# Patient Record
Sex: Male | Born: 1985 | Race: Black or African American | Hispanic: No | Marital: Single | Smoking: Never smoker
Health system: Southern US, Community
[De-identification: ages and names within clinical notes are randomized; demographics above are authoritative.]

## PROBLEM LIST (undated history)

## (undated) DIAGNOSIS — L0501 Pilonidal cyst with abscess: Secondary | ICD-10-CM

---
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
## 2006-03-05 ENCOUNTER — Emergency Department: Payer: Self-pay | Admitting: Emergency Medicine

## 2006-10-30 ENCOUNTER — Emergency Department: Payer: Self-pay | Admitting: Emergency Medicine

## 2006-11-12 ENCOUNTER — Emergency Department: Payer: Self-pay | Admitting: Internal Medicine

## 2006-12-09 ENCOUNTER — Emergency Department: Payer: Self-pay | Admitting: General Practice

## 2009-05-30 ENCOUNTER — Emergency Department (HOSPITAL_COMMUNITY): Admission: EM | Admit: 2009-05-30 | Discharge: 2009-05-30 | Payer: Self-pay | Admitting: Emergency Medicine

## 2009-09-01 ENCOUNTER — Emergency Department (HOSPITAL_COMMUNITY): Admission: EM | Admit: 2009-09-01 | Discharge: 2009-09-01 | Payer: Self-pay | Admitting: Emergency Medicine

## 2011-03-07 ENCOUNTER — Inpatient Hospital Stay (INDEPENDENT_AMBULATORY_CARE_PROVIDER_SITE_OTHER)
Admission: RE | Admit: 2011-03-07 | Discharge: 2011-03-07 | Disposition: A | Discharge status: Home / Self Care | Payer: Self-pay | Source: Ambulatory Visit | Attending: Family Medicine | Admitting: Family Medicine

## 2011-03-07 DIAGNOSIS — T148XXA Other injury of unspecified body region, initial encounter: Secondary | ICD-10-CM

## 2012-09-24 ENCOUNTER — Other Ambulatory Visit: Payer: Self-pay | Admitting: Physical Medicine and Rehabilitation

## 2012-09-24 ENCOUNTER — Ambulatory Visit
Admission: RE | Admit: 2012-09-24 | Discharge: 2012-09-24 | Disposition: A | Discharge status: Home / Self Care | Payer: No Typology Code available for payment source | Source: Ambulatory Visit | Attending: Physical Medicine and Rehabilitation | Admitting: Physical Medicine and Rehabilitation

## 2012-09-24 DIAGNOSIS — Z021 Encounter for pre-employment examination: Secondary | ICD-10-CM

## 2012-10-27 ENCOUNTER — Emergency Department (INDEPENDENT_AMBULATORY_CARE_PROVIDER_SITE_OTHER): Payer: Self-pay

## 2012-10-27 ENCOUNTER — Emergency Department (INDEPENDENT_AMBULATORY_CARE_PROVIDER_SITE_OTHER)
Admission: EM | Admit: 2012-10-27 | Discharge: 2012-10-27 | Disposition: A | Discharge status: Home / Self Care | Payer: Self-pay | Source: Home / Self Care

## 2012-10-27 ENCOUNTER — Encounter (HOSPITAL_COMMUNITY): Payer: Self-pay | Admitting: Emergency Medicine

## 2012-10-27 DIAGNOSIS — L0390 Cellulitis, unspecified: Secondary | ICD-10-CM

## 2012-10-27 DIAGNOSIS — L0291 Cutaneous abscess, unspecified: Secondary | ICD-10-CM

## 2012-10-27 DIAGNOSIS — J069 Acute upper respiratory infection, unspecified: Secondary | ICD-10-CM

## 2012-10-27 HISTORY — DX: Pilonidal cyst with abscess: L05.01

## 2012-10-27 MED ORDER — HYDROCODONE-ACETAMINOPHEN 5-325 MG PO TABS: 1.0000 | ORAL_TABLET | ORAL | Status: DC | PRN

## 2012-10-27 MED ORDER — HYDROCODONE-ACETAMINOPHEN 5-325 MG PO TABS
ORAL_TABLET | ORAL | Status: AC
  Filled 2012-10-27: qty 2

## 2012-10-27 MED ORDER — HYDROCODONE-ACETAMINOPHEN 5-325 MG PO TABS
2.0000 | ORAL_TABLET | Freq: Once | ORAL | Status: AC
  Administered 2012-10-27: 2 via ORAL

## 2012-10-27 MED ORDER — IBUPROFEN 800 MG PO TABS
800.0000 mg | ORAL_TABLET | Freq: Once | ORAL | Status: AC
  Administered 2012-10-27: 800 mg via ORAL

## 2012-10-27 MED ORDER — DOXYCYCLINE HYCLATE 100 MG PO CAPS: 100.0000 mg | ORAL_CAPSULE | Freq: Two times a day (BID) | ORAL | Status: DC

## 2012-10-27 MED ORDER — IBUPROFEN 800 MG PO TABS
ORAL_TABLET | ORAL | Status: AC
  Filled 2012-10-27: qty 1

## 2012-10-27 NOTE — ED Notes (Signed)
Pt c/o cold sx x3.5 weeks.  Sx include: dizziness, fatigue, fevers, dry cough Denies: vomiting, nauseas, diarrhea  Also c/o abscess near coccyx x2 weeks.  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

## 2012-10-27 NOTE — ED Provider Notes (Signed)
History     CSN: [id number]  Arrival date & time 10/27/12  1611   None     Chief Complaint  Patient presents with  . URI  . Abscess     HPI Pt c/o cold sx x 3.5 weeks. Sx's have included dizziness, fatigue, intermittent fevers and a dry cough that just started today. Denies abd pain,vomiting, nauseas or diarrhea.  Also c/o abscess near coccyx x 2 weeks. States mild TTP started approx 2 weeks ago and has now progressed in size w/ pain that now  radiates towards both legs and back. Admits to hx of these (Pilonidal) cyst before that have often required lancing.     Past Medical History  Diagnosis Date  . Pilonidal cyst with abscess     History reviewed. No pertinent past surgical history.  No family history on file.  History  Substance Use Topics  . Smoking status: Never Smoker   . Smokeless tobacco: Not on file  . Alcohol Use: No      Review of Systems  Constitutional: Positive for fever, chills and fatigue.  HENT: Negative for ear pain, congestion, rhinorrhea and neck pain.   Eyes: Negative for pain.  Respiratory: Positive for cough. Negative for chest tightness, shortness of breath, wheezing and stridor.   Cardiovascular: Negative for leg swelling.  Genitourinary: Negative.   Musculoskeletal: Positive for back pain.  Neurological: Positive for dizziness.  Hematological: Negative.   Psychiatric/Behavioral: Negative.     Allergies  Review of patient's allergies indicates no known allergies.  Home Medications   Current Outpatient Rx  Name  Route  Sig  Dispense  Refill  . ASPIRIN PO   Oral   Take by mouth.         . IBUPROFEN 600 MG PO TABS   Oral   Take 600 mg by mouth every 6 (six) hours as needed.           BP 109/54  Pulse 149  Temp 101.8 F (38.8 C) (Oral)  Resp 22  SpO2 98%  Physical Exam  Constitutional: He is oriented to person, place, and time. He appears well-developed and well-nourished. He appears ill.  HENT:  Head:  Normocephalic and atraumatic.  Right Ear: Tympanic membrane and external ear normal.  Left Ear: Tympanic membrane and external ear normal.  Nose: Nose normal.  Mouth/Throat: Uvula is midline, oropharynx is clear and moist and mucous membranes are normal.  Eyes: Conjunctivae normal are normal.  Neck: Normal range of motion. Neck supple.  Cardiovascular: Normal rate and regular rhythm.   Pulmonary/Chest: Effort normal.  Neurological: He is alert and oriented to person, place, and time.  Skin: Skin is warm and dry.          Approx 3 cm raised erythematous nodule just above the gluteal fold cleft at the coccyx area. No active drainage. Small  amount of erythema and induration at periphery of the wound.  Warm to touch. Very TTP. Small area of fluctuance at the inferior aspect of the abscess.    Psychiatric: He has a normal mood and affect.    ED Course  INCISION AND DRAINAGE Date/Time: 10/27/2012 7:30 PM Performed by: Leanne Chang Authorized by: Leanne Chang Consent: Verbal consent obtained. Written consent not obtained. Risks and benefits: risks, benefits and alternatives were discussed Consent given by: patient Patient understanding: patient states understanding of the procedure being performed Required items: required blood products, implants, devices, and special equipment available Patient identity confirmed: verbally with  patient and arm band Type: abscess Anesthesia: local infiltration Local anesthetic: lidocaine 1% without epinephrine Anesthetic total: 2.5 ml Patient sedated: no Scalpel size: 11 Incision type: single straight Complexity: simple Drainage: bloody Drainage amount: moderate Wound treatment: drain placed Packing material: 1/2 in iodoform gauze Patient tolerance: Patient tolerated the procedure well with no immediate complications. Comments: Minimal purulent drainage    (including critical care time)   Labs Reviewed  INFLUENZA PANEL BY PCR    Dg Chest 2 View  10/27/2012  *RADIOLOGY REPORT*  Clinical Data: Cough and fever  CHEST - 2 VIEW  Comparison: 09/24/2012  Findings: The heart size and vascularity are normal.  Lungs are clear without infiltrate or effusion.  No mass lesion and no change from the prior study.  IMPRESSION: No active cardiopulmonary disease.   Original Report Authenticated By: Janeece Riggers, M.D.     URI Pilonidal abscess    MDM  2 to 3 weeks of URI sx's w/ recent onset of fever and cough. CXR negative. BBS CTA. Explained importance of rest, meds for fever and hydration. Likely viral process. 2 weeks worsening pain and swelling of pilonidal cyst. H/o same. I&D performed. Packing placed. Pt ask to do warm soaks/baths/sitz as often as able and return w/ packing in 2 days for recheck as wound may need packing again. Pt agreeable.       Leanne Chang, NP 10/29/12 1229

## 2012-10-30 ENCOUNTER — Encounter (HOSPITAL_COMMUNITY): Payer: Self-pay | Admitting: *Deleted

## 2012-10-30 ENCOUNTER — Emergency Department (INDEPENDENT_AMBULATORY_CARE_PROVIDER_SITE_OTHER)
Admission: EM | Admit: 2012-10-30 | Discharge: 2012-10-30 | Disposition: A | Discharge status: Home / Self Care | Payer: Self-pay | Source: Home / Self Care | Attending: Family Medicine | Admitting: Family Medicine

## 2012-10-30 DIAGNOSIS — L0501 Pilonidal cyst with abscess: Secondary | ICD-10-CM

## 2012-10-30 NOTE — ED Notes (Signed)
Seen      sev  Days  Ago  ucc   Had  i  And  D       And  Packing  Pt  States  He  Had  Another  Boil  Come  Up  As  Well  He  reportts  He  Got  His  meds  Filled             He  Reports  He  Feels   Somewhat  Better

## 2012-10-30 NOTE — ED Provider Notes (Signed)
History     CSN: [id number]  Arrival date & time 10/30/12  1148   First MD Initiated Contact with Patient 10/30/12 1202      Chief Complaint  Patient presents with  . Follow-up    (Consider location/radiation/quality/duration/timing/severity/associated sxs/prior treatment) Patient is a 27 y.o. male presenting with abscess. The history is provided by the patient.  Abscess  This is a new problem. The current episode started less than one week ago (seen 1/4 for i+d of pilonidal abscess, here for recheck with 2nd site spont draining while waiting [facility] The onset was sudden. The problem has been gradually worsening. The abscess is present on the left buttock. The problem is moderate. The abscess is characterized by draining, swelling and painfulness.    Past Medical History  Diagnosis Date  . Pilonidal cyst with abscess     History reviewed. No pertinent past surgical history.  No family history on file.  History  Substance Use Topics  . Smoking status: Never Smoker   . Smokeless tobacco: Not on file  . Alcohol Use: No      Review of Systems  Constitutional: Negative.   Skin: Positive for wound.    Allergies  Review of patient's allergies indicates no known allergies.  Home Medications   Current Outpatient Rx  Name  Route  Sig  Dispense  Refill  . ASPIRIN PO   Oral   Take by mouth.         . DOXYCYCLINE HYCLATE 100 MG PO CAPS   Oral   Take 1 capsule (100 mg total) by mouth 2 (two) times daily.   20 capsule   0   . HYDROCODONE-ACETAMINOPHEN 5-325 MG PO TABS   Oral   Take 1 tablet by mouth every 4 (four) hours as needed for pain.   10 tablet   0   . IBUPROFEN 600 MG PO TABS   Oral   Take 600 mg by mouth every 6 (six) hours as needed.           BP 136/71  Pulse 78  Temp 99 F (37.2 C) (Oral)  Resp 16  SpO2 100%  Physical Exam  Nursing note and vitals reviewed. Constitutional: He is oriented to person, place, and time. He appears  well-developed and well-nourished.  Neurological: He is alert and oriented to person, place, and time.  Skin: Skin is warm and dry.       Pilonidal abscess draining    ED Course  INCISION AND DRAINAGE Date/Time: 10/30/2012 12:52 PM Performed by: Linna Hoff Authorized by: Bradd Canary D Consent: Verbal consent obtained. Consent given by: patient Type: abscess Body area: anogenital Location details: pilonidal Patient sedated: no Scalpel size: 11 Incision type: single straight Complexity: simple Drainage: purulent Drainage amount: copious Patient tolerance: Patient tolerated the procedure well with no immediate complications. Comments: Copious drainage expressed from i+d, purulent fluid over 200cc.   (including critical care time)  Labs Reviewed - No data to display No results found.   1. Pilonidal cyst with abscess       MDM          Linna Hoff, MD 10/30/12 1300

## 2012-10-31 NOTE — ED Provider Notes (Signed)
Medical screening examination/treatment/procedure(s) were performed by resident physician or non-physician practitioner and as supervising physician I was immediately available for consultation/collaboration.   Barkley Bruns MD.    Linna Hoff, MD 10/31/12 [phone]

## 2013-10-26 ENCOUNTER — Emergency Department (HOSPITAL_COMMUNITY)
Admission: EM | Admit: 2013-10-26 | Discharge: 2013-10-26 | Disposition: A | Discharge status: Home / Self Care | Payer: 59 | Source: Home / Self Care | Attending: Family Medicine | Admitting: Family Medicine

## 2013-10-26 ENCOUNTER — Encounter (HOSPITAL_COMMUNITY): Payer: Self-pay | Admitting: Emergency Medicine

## 2013-10-26 DIAGNOSIS — R69 Illness, unspecified: Principal | ICD-10-CM

## 2013-10-26 DIAGNOSIS — J111 Influenza due to unidentified influenza virus with other respiratory manifestations: Secondary | ICD-10-CM

## 2013-10-26 MED ORDER — OSELTAMIVIR PHOSPHATE 75 MG PO CAPS: 75.0000 mg | ORAL_CAPSULE | Freq: Two times a day (BID) | ORAL | Status: DC

## 2013-10-26 MED ORDER — IPRATROPIUM BROMIDE 0.06 % NA SOLN: 2.0000 | Freq: Four times a day (QID) | NASAL | Status: DC

## 2013-10-26 NOTE — ED Notes (Signed)
Pt  Reports  He  Awoke  Yesterday         With  Body  Aches   fver  And  sorethroat  With  Chills       Pt  Sitting  Upright on  Exam table  Speaking in  Complete  sentances  And is  In no  Severe  Distress

## 2013-10-26 NOTE — ED Provider Notes (Signed)
CSN: [id number]     Arrival date & time 10/26/13  1128 History   First MD Initiated Contact with Patient 10/26/13 1400     Chief Complaint  Patient presents with  . Fever   (Consider location/radiation/quality/duration/timing/severity/associated sxs/prior Treatment) Patient is a 28 y.o. male presenting with fever. The history is provided by the patient.  Fever Temp source:  Tactile Severity:  Moderate Onset quality:  Sudden Duration:  1 day Progression:  Unchanged Chronicity:  New Relieved by:  Acetaminophen Associated symptoms: congestion, cough, myalgias, rhinorrhea and sore throat   Associated symptoms: no nausea and no vomiting   Risk factors: sick contacts     Past Medical History  Diagnosis Date  . Pilonidal cyst with abscess    History reviewed. No pertinent past surgical history. History reviewed. No pertinent family history. History  Substance Use Topics  . Smoking status: Never Smoker   . Smokeless tobacco: Not on file  . Alcohol Use: No    Review of Systems  Constitutional: Positive for fever.  HENT: Positive for congestion, postnasal drip, rhinorrhea and sore throat.   Eyes: Negative.   Respiratory: Positive for cough.   Gastrointestinal: Negative.  Negative for nausea and vomiting.  Genitourinary: Negative.   Musculoskeletal: Positive for myalgias.  Skin: Negative.     Allergies  Review of patient's allergies indicates no known allergies.  Home Medications   Current Outpatient Rx  Name  Route  Sig  Dispense  Refill  . ASPIRIN PO   Oral   Take by mouth.         . doxycycline (VIBRAMYCIN) 100 MG capsule   Oral   Take 1 capsule (100 mg total) by mouth 2 (two) times daily.   20 capsule   0   . HYDROcodone-acetaminophen (NORCO/VICODIN) 5-325 MG per tablet   Oral   Take 1 tablet by mouth every 4 (four) hours as needed for pain.   10 tablet   0   . ibuprofen (ADVIL,MOTRIN) 600 MG tablet   Oral   Take 600 mg by mouth every 6 (six) hours as  needed.         Marland Kitchen ipratropium (ATROVENT) 0.06 % nasal spray   Nasal   Place 2 sprays into the nose 4 (four) times daily.   15 mL   1   . oseltamivir (TAMIFLU) 75 MG capsule   Oral   Take 1 capsule (75 mg total) by mouth every 12 (twelve) hours. Take all of medication.   10 capsule   0    BP 138/82  Pulse 95  Temp(Src) 100 F (37.8 C) (Oral)  Resp 20  SpO2 99% Physical Exam  Nursing note and vitals reviewed. Constitutional: He is oriented to person, place, and time. He appears well-developed and well-nourished. No distress.  HENT:  Head: Normocephalic.  Right Ear: External ear normal.  Left Ear: External ear normal.  Mouth/Throat: Oropharynx is clear and moist.  Neck: Normal range of motion. Neck supple.  Cardiovascular: Normal rate and normal heart sounds.   Pulmonary/Chest: Effort normal and breath sounds normal.  Abdominal: Soft. Bowel sounds are normal. There is no tenderness.  Lymphadenopathy:    He has no cervical adenopathy.  Neurological: He is alert and oriented to person, place, and time.  Skin: Skin is warm and dry.    ED Course  Procedures (including critical care time) Labs Review Labs Reviewed - No data to display Imaging Review No results found.  EKG Interpretation    Date/Time:  Ventricular Rate:    PR Interval:    QRS Duration:   QT Interval:    QTC Calculation:   R Axis:     Text Interpretation:              MDM      Linna HoffJames D Kindl, MD 10/26/13 1420

## 2014-05-15 DIAGNOSIS — R5383 Other fatigue: Secondary | ICD-10-CM

## 2014-05-15 DIAGNOSIS — L0501 Pilonidal cyst with abscess: Secondary | ICD-10-CM | POA: Insufficient documentation

## 2014-05-15 DIAGNOSIS — R61 Generalized hyperhidrosis: Secondary | ICD-10-CM | POA: Insufficient documentation

## 2014-05-15 DIAGNOSIS — K6289 Other specified diseases of anus and rectum: Secondary | ICD-10-CM | POA: Insufficient documentation

## 2014-05-15 DIAGNOSIS — R5381 Other malaise: Secondary | ICD-10-CM | POA: Insufficient documentation

## 2014-05-16 ENCOUNTER — Encounter (HOSPITAL_COMMUNITY): Payer: Self-pay | Admitting: Emergency Medicine

## 2014-05-16 ENCOUNTER — Emergency Department (HOSPITAL_COMMUNITY)
Admission: EM | Admit: 2014-05-16 | Discharge: 2014-05-16 | Disposition: A | Discharge status: Home / Self Care | Payer: 59 | Attending: Emergency Medicine | Admitting: Emergency Medicine

## 2014-05-16 DIAGNOSIS — L0501 Pilonidal cyst with abscess: Secondary | ICD-10-CM

## 2014-05-16 MED ORDER — HYDROCODONE-ACETAMINOPHEN 5-325 MG PO TABS
2.0000 | ORAL_TABLET | Freq: Once | ORAL | Status: AC
  Administered 2014-05-16: 2 via ORAL
  Filled 2014-05-16: qty 2

## 2014-05-16 MED ORDER — CLINDAMYCIN HCL 300 MG PO CAPS
300.0000 mg | ORAL_CAPSULE | Freq: Once | ORAL | Status: AC
  Administered 2014-05-16: 300 mg via ORAL
  Filled 2014-05-16: qty 1

## 2014-05-16 MED ORDER — CLINDAMYCIN HCL 300 MG PO CAPS: 300.0000 mg | ORAL_CAPSULE | Freq: Four times a day (QID) | ORAL | Status: DC

## 2014-05-16 MED ORDER — HYDROCODONE-ACETAMINOPHEN 5-325 MG PO TABS
2.0000 | ORAL_TABLET | Freq: Once | ORAL | Status: DC
  Filled 2014-05-16: qty 2

## 2014-05-16 NOTE — ED Provider Notes (Signed)
CSN: [id number]     Arrival date & time 05/15/14  2342 History   First MD Initiated Contact with Patient 05/16/14 0142     Chief Complaint  Patient presents with  . Abscess     (Consider location/radiation/quality/duration/timing/severity/associated sxs/prior Treatment) HPI Comments: Pt comes in with cc of abscess and feeling unwell.  Pt has hx of pilonidal cyst - and it flare ups occasionally, last one was more than a year ago. The current flare up started last week - and overtime patient developed some purulent drainage and serosanguinous drainage. Pt has been applying gauze to the area. Pt has pain in there area - sore to touch, but otherwise 2/10, and he is able to drain just fine. He decided to come in to the ER as he started feeling sick for the past 4 days. He has some weakness, and has broke out in sweats and has had chills. Pt has needed antibiotics in the past, and decided to come to the ER to see if that would help.  Patient is a 28 y.o. male presenting with abscess. The history is provided by the patient.  Abscess Associated symptoms: fever   Associated symptoms: no nausea     Past Medical History  Diagnosis Date  . Pilonidal cyst with abscess    History reviewed. No pertinent past surgical history. No family history on file. History  Substance Use Topics  . Smoking status: Never Smoker   . Smokeless tobacco: Not on file  . Alcohol Use: No    Review of Systems  Constitutional: Positive for fever, chills and diaphoresis. Negative for activity change and appetite change.  Respiratory: Negative for cough and shortness of breath.   Cardiovascular: Negative for chest pain.  Gastrointestinal: Positive for rectal pain. Negative for nausea, abdominal pain and blood in stool.  Genitourinary: Negative for dysuria.  Neurological: Positive for weakness.      Allergies  Review of patient's allergies indicates no known allergies.  Home Medications   Prior to Admission  medications   Medication Sig Start Date End Date Taking? Authorizing Provider  ibuprofen (ADVIL,MOTRIN) 600 MG tablet Take 600 mg by mouth every 6 (six) hours as needed for moderate pain.    Yes Historical Provider, MD  clindamycin (CLEOCIN) 300 MG capsule Take 1 capsule (300 mg total) by mouth 4 (four) times daily. 05/16/14   Kameelah Minish Rhunette Croft, MD   BP 116/57  Pulse 87  Temp(Src) 99 F (37.2 C) (Oral)  Resp 22  Ht 6\' 1"  (1.854 m)  Wt 230 lb (104.327 kg)  BMI 30.35 kg/m2  SpO2 98% Physical Exam  Nursing note and vitals reviewed. Constitutional: He is oriented to person, place, and time. He appears well-developed.  HENT:  Head: Normocephalic and atraumatic.  Eyes: Conjunctivae and EOM are normal. Pupils are equal, round, and reactive to light.  Neck: Normal range of motion. Neck supple.  Cardiovascular: Normal rate and regular rhythm.   Pulmonary/Chest: Effort normal and breath sounds normal.  Abdominal: Soft. Bowel sounds are normal. He exhibits no distension. There is no tenderness. There is no rebound and no guarding.  Neurological: He is alert and oriented to person, place, and time.  Skin: Skin is warm.    ED Course  INCISION AND DRAINAGE Date/Time: 05/16/2014 4:33 AM Performed by: Derwood Kaplan Authorized by: Derwood Kaplan Consent: Verbal consent obtained. Risks and benefits: risks, benefits and alternatives were discussed Consent given by: patient Patient understanding: patient states understanding of the procedure being performed Patient identity  confirmed: verbally with patient Body area: anogenital Location details: pilonidal Drainage: purulent Drainage amount: copious Wound treatment: wound left open   (including critical care time) Labs Review Labs Reviewed - No data to display  Imaging Review No results found.   EKG Interpretation None      MDM   Final diagnoses:  Pilonidal abscess    Pt with abscess - draining. Hx of pilonidal cyst. Pt had a  small opening, we made it a little larger, and drained copious amount of abscess out. Pt has tenderness and fluctuance that tracks up cephalad from the cyst. Pt has systemic signs and some erythema.  Asked him to see Surgery soon, so that they can clinically evaluate him for possible fistula. Return precautions discussed. Pt reliable.    Derwood KaplanAnkit Zyshonne Malecha, MD 05/16/14 [phone]

## 2014-05-16 NOTE — Discharge Instructions (Signed)
You have pilonidal cyst with abscess. It is important that you see a Surgeon given your symptoms. Take antibiotics as prescribed.  Please return to the ER if your symptoms worsen; you have increased pain, fevers, chills, inability to keep any medications down, confusion. Otherwise see the outpatient doctor as requested.   Pilonidal Cyst A pilonidal cyst occurs when hairs get trapped (ingrown) beneath the skin in the crease between the buttocks over your sacrum (the bone under that crease). Pilonidal cysts are most common in young men with a lot of body hair. When the cyst is ruptured (breaks) or leaking, fluid from the cyst may cause burning and itching. If the cyst becomes infected, it causes a painful swelling filled with pus (abscess). The pus and trapped hairs need to be removed (often by lancing) so that the infection can heal. However, recurrence is common and an operation may be needed to remove the cyst. HOME CARE INSTRUCTIONS   If the cyst was NOT INFECTED:  Keep the area clean and dry. Bathe or shower daily. Wash the area well with a germ-killing soap. Warm tub baths may help prevent infection and help with drainage. Dry the area well with a towel.  Avoid tight clothing to keep area as moisture free as possible.  Keep area between buttocks as free of hair as possible. A depilatory may be used.  If the cyst WAS INFECTED and needed to be drained:  Your caregiver packed the wound with gauze to keep the wound open. This allows the wound to heal from the inside outwards and continue draining.  Return for a wound check in 1 day or as suggested.  If you take tub baths or showers, repack the wound with gauze following them. Sponge baths (at the sink) are a good alternative.  If an antibiotic was ordered to fight the infection, take as directed.  Only take over-the-counter or prescription medicines for pain, discomfort, or fever as directed by your caregiver.  After the drain is  removed, use sitz baths for 20 minutes 4 times per day. Clean the wound gently with mild unscented soap, pat dry, and then apply a dry dressing. SEEK MEDICAL CARE IF:   You have increased pain, swelling, redness, drainage, or bleeding from the area.  You have a fever.  You have muscles aches, dizziness, or a general ill feeling. Document Released: 10/07/2000 Document Revised: 01/02/2012 Document Reviewed: 12/05/2008 [facility] Patient Information 2015 [place], [place]. This information is not intended to replace advice given to you by your health care provider. Make sure you discuss any questions you have with your health care provider.

## 2014-05-16 NOTE — ED Notes (Signed)
Pt. reports abscess at buttocks with purulent drainage onset last week .

## 2014-06-10 ENCOUNTER — Encounter (INDEPENDENT_AMBULATORY_CARE_PROVIDER_SITE_OTHER): Payer: Self-pay | Admitting: Surgery

## 2014-06-10 ENCOUNTER — Encounter (INDEPENDENT_AMBULATORY_CARE_PROVIDER_SITE_OTHER): Payer: Self-pay

## 2014-06-10 ENCOUNTER — Ambulatory Visit (INDEPENDENT_AMBULATORY_CARE_PROVIDER_SITE_OTHER): Payer: 59 | Admitting: Surgery

## 2014-06-10 VITALS — BP 118/80 | HR 82 | Temp 98.0°F | Ht 73.0 in | Wt 230.0 lb

## 2014-06-10 DIAGNOSIS — L988 Other specified disorders of the skin and subcutaneous tissue: Secondary | ICD-10-CM

## 2014-06-10 NOTE — Progress Notes (Signed)
Subjective:     Patient ID: Aaron Munoz, male   DOB: [birth year], 28 y.o.   MRN: [id number]  HPI  Note: Portions of this report may have been transcribed using voice recognition software. Every effort was made to ensure accuracy; however, inadvertent computerized transcription errors may be present.   Any transcriptional errors that result from this process are unintentional.            Aaron Munoz  [birth year] [id number]  Patient Care Team: No Pcp Per Patient as PCP - General (General Practice)  This patient is a 28 y.o.male who presents today for surgical evaluation at the request of Dr. Valinda PartyAnkit Nanatavi, [place].   Reason for visit: Pilonidal disease with recurrent abscesses  Pleasant active male.  History with abscesses on his tailbone for the past 8 years.  Has had at least 4 flares.  Has had to have 3 of them lanced.  Had not had a flareup for several years.  However, had painful swelling last month.  Required incision and drainage and antibiotics.  He is improved.  He had held off on considering surgery but with this recent severely painful attack, he wished to reconsider this.  Normally has bowel movements every other day.  He has never smoked.  No history of diabetes or wound infections.  No history of fall or trauma.  Patient Active Problem List   Diagnosis Date Noted  . Pilonidal disease 06/10/2014    Past Medical History  Diagnosis Date  . Pilonidal cyst with abscess     No past surgical history on file.  History   Social History  . Marital Status: Single    Spouse Name: N/A    Number of Children: N/A  . Years of Education: N/A   Occupational History  . Not on file.   Social History Main Topics  . Smoking status: Never Smoker   . Smokeless tobacco: Not on file  . Alcohol Use: No  . Drug Use: No  . Sexual Activity: Not on file   Other Topics Concern  . Not on file   Social History Narrative  . No narrative on file    No family history on  file.  Current Outpatient Prescriptions  Medication Sig Dispense Refill  . ibuprofen (ADVIL,MOTRIN) 600 MG tablet Take 600 mg by mouth every 6 (six) hours as needed for moderate pain.        No current facility-administered medications for this visit.     No Known Allergies  BP 118/80  Pulse 82  Temp(Src) 98 F (36.7 C)  Ht 6\' 1"  (1.854 m)  Wt 230 lb (104.327 kg)  BMI 30.35 kg/m2  No results found.   Review of Systems  Constitutional: Negative for fever, chills and diaphoresis.  HENT: Negative for ear discharge, facial swelling, mouth sores, nosebleeds, sore throat and trouble swallowing.   Eyes: Negative for photophobia, discharge and visual disturbance.  Respiratory: Negative for choking, chest tightness, shortness of breath and stridor.   Cardiovascular: Negative for chest pain and palpitations.  Gastrointestinal: Negative for nausea, vomiting, abdominal pain, diarrhea, constipation, blood in stool, abdominal distention, anal bleeding and rectal pain.  Endocrine: Negative for cold intolerance and heat intolerance.  Genitourinary: Negative for dysuria, urgency, difficulty urinating and testicular pain.  Musculoskeletal: Negative for arthralgias, back pain, gait problem and myalgias.  Skin: Negative for color change, pallor, rash and wound.  Allergic/Immunologic: Negative for environmental allergies and food allergies.  Neurological: Negative for dizziness, speech difficulty, weakness, numbness and  headaches.  Hematological: Negative for adenopathy. Does not bruise/bleed easily.  Psychiatric/Behavioral: Negative for hallucinations, confusion and agitation.       Objective:   Physical Exam  Constitutional: He is oriented to person, place, and time. He appears well-developed and well-nourished. No distress.  HENT:  Head: Normocephalic.  Mouth/Throat: Oropharynx is clear and moist. No oropharyngeal exudate.  Eyes: Conjunctivae and EOM are normal. Pupils are equal, round,  and reactive to light. No scleral icterus.  Neck: Normal range of motion. Neck supple. No tracheal deviation present.  Cardiovascular: Normal rate, regular rhythm and intact distal pulses.   Pulmonary/Chest: Effort normal and breath sounds normal. No respiratory distress.  Abdominal: Soft. He exhibits no distension. There is no tenderness. Hernia confirmed negative in the right inguinal area and confirmed negative in the left inguinal area.  Musculoskeletal: Normal range of motion. He exhibits no tenderness.       Back:  Lymphadenopathy:    He has no cervical adenopathy.       Right: No inguinal adenopathy present.       Left: No inguinal adenopathy present.  Neurological: He is alert and oriented to person, place, and time. No cranial nerve deficit. He exhibits normal muscle tone. Coordination normal.  Skin: Skin is warm and dry. No rash noted. He is not diaphoretic. No erythema. No pallor.  Psychiatric: He has a normal mood and affect. His behavior is normal. Judgment and thought content normal.       Assessment:     Pilonidal disease with recurrent abscesses.  Fourth abscess lanced last month.  No evidence of active infection.     Plan:     I think he would benefit from surgery to remove this area of chronic disease.  Plan outpatient surgery.  He is interested in proceeding to try to coordinate between work and the newborn expected in October:  The anatomy of the intragluteal cleft was discussed. Pathophysiology of pilonidal disease was discussed. The importance of keeping hairs trimmed to avoid recurrence was discussed. Discussion of options such as curretage, excision with closure vs leaving open was discussed. Risks of infection and need for incision and drainage & antibiotics were discussed.  I noted a good likelihood this will help address the problem.    At this point, I think the patient would best served with considering surgery to excise the diseased tissue. I will make an  attempt to close but it may need to be left open to allow it to heal with secondary intention and wound packing. Possible recurrences involve muscle flaps or different techniques were discussed as well. I noted that recurrence is higher with poor compliance on care maintenance and hygiene. The patient's questions were answered. The patient agrees to proceed.   The anatomy of the gluteal and sacral region was discussed.  Pathophysiology of pilonidal disease due to ingrown hairs was discussed.  Importance of good hygiene discussed.  Importance of keeping hairs trimmed in the intergluteal crease from anus to top of sacrum/tailbone spine noted.  Need for good hygiene stressed.  Use of electric razor or nose hair trimmers to keep the hairs trimmed discussed.  Risk of worsening progression needing surgical drainage of abscess or perhaps excision of chronic pilonidal disease with skin flap closure over drains discussed.  Possible need to leave it open with packing to allow the wound close over several weeks/months discussed.  Risks benefits alternatives to surgery discussed as well.  Risk of recurrent disease discussed.  Patient was expressed  understanding.  Literature given to patient.  We will see if we can control this without an operation first.

## 2014-06-10 NOTE — Patient Instructions (Signed)
Please consider the recommendations that we have given you today:  Consider surgery to resect any chronic pilonidal disease off your tailbone.  Plan outpatient surgery.  See the Handout(s) we have given you.  Please call our office at [phone] if you wish to schedule surgery or if you have further questions / concerns.   Pilonidal Cyst A pilonidal cyst occurs when hairs get trapped (ingrown) beneath the skin in the crease between the buttocks over your sacrum (the bone under that crease). Pilonidal cysts are most common in young men with a lot of body hair. When the cyst is ruptured (breaks) or leaking, fluid from the cyst may cause burning and itching. If the cyst becomes infected, it causes a painful swelling filled with pus (abscess). The pus and trapped hairs need to be removed (often by lancing) so that the infection can heal. However, recurrence is common and an operation may be needed to remove the cyst. HOME CARE INSTRUCTIONS   If the cyst was NOT INFECTED:  Keep the area clean and dry. Bathe or shower daily. Wash the area well with a germ-killing soap. Warm tub baths may help prevent infection and help with drainage. Dry the area well with a towel.  Avoid tight clothing to keep area as moisture free as possible.  Keep area between buttocks as free of hair as possible. A depilatory may be used.  If the cyst WAS INFECTED and needed to be drained:  Your caregiver packed the wound with gauze to keep the wound open. This allows the wound to heal from the inside outwards and continue draining.  Return for a wound check in 1 day or as suggested.  If you take tub baths or showers, repack the wound with gauze following them. Sponge baths (at the sink) are a good alternative.  If an antibiotic was ordered to fight the infection, take as directed.  Only take over-the-counter or prescription medicines for pain, discomfort, or fever as directed by your caregiver.  After the drain is  removed, use sitz baths for 20 minutes 4 times per day. Clean the wound gently with mild unscented soap, pat dry, and then apply a dry dressing. SEEK MEDICAL CARE IF:   You have increased pain, swelling, redness, drainage, or bleeding from the area.  You have a fever.  You have muscles aches, dizziness, or a general ill feeling. Document Released: 10/07/2000 Document Revised: 01/02/2012 Document Reviewed: 12/05/2008 [facility] Patient Information 2015 [place], [place]. This information is not intended to replace advice given to you by your health care provider. Make sure you discuss any questions you have with your health care provider.  GENERAL SURGERY: POST OP INSTRUCTIONS  1. DIET: Follow a light bland diet the first 24 hours after arrival home, such as soup, liquids, crackers, etc.  Be sure to include lots of fluids daily.  Avoid fast food or heavy meals as your are more likely to get nauseated.   2. Take your usually prescribed home medications unless otherwise directed. 3. PAIN CONTROL: a. Pain is best controlled by a usual combination of three different methods TOGETHER: i. Ice/Heat ii. Over the counter pain medication iii. Prescription pain medication b. Most patients will experience some swelling and bruising around the incisions.  Ice packs or heating pads (30-60 minutes up to 6 times a day) will help. Use ice for the first few days to help decrease swelling and bruising, then switch to heat to help relax tight/sore spots and speed recovery.  Some  people prefer to use ice alone, heat alone, alternating between ice & heat.  Experiment to what works for you.  Swelling and bruising can take several weeks to resolve.   c. It is helpful to take an over-the-counter pain medication regularly for the first few weeks.  Choose one of the following that works best for you: i. Naproxen (Aleve, etc)  Two 220mg  tabs twice a day ii. Ibuprofen (Advil, etc) Three 200mg  tabs four times a day (every meal  & bedtime) iii. Acetaminophen (Tylenol, etc) 500-650mg  four times a day (every meal & bedtime) d. A  prescription for pain medication (such as oxycodone, hydrocodone, etc) should be given to you upon discharge.  Take your pain medication as prescribed.  i. If you are having problems/concerns with the prescription medicine (does not control pain, nausea, vomiting, rash, itching, etc), please call us [phone] to see if we need to switch you to a different pain medicine that will work better for you and/or control your side effect better. ii. If you need a refill on your pain medication, please contact your pharmacy.  They will contact our office to request authorization. Prescriptions will not be filled after 5 pm or on week-ends. 4. Avoid getting constipated.  Between the surgery and the pain medications, it is common to experience some constipation.  Increasing fluid intake and taking a fiber supplement (such as Metamucil, Citrucel, FiberCon, MiraLax, etc) 1-2 times a day regularly will usually help prevent this problem from occurring.  A mild laxative (prune juice, Milk of Magnesia, MiraLax, etc) should be taken according to package directions if there are no bowel movements after 48 hours.   5. Wash / shower every day.  You may shower over the dressings as they are waterproof.  Continue to shower over incision(s) after the dressing is off. 6. Remove your waterproof bandages 5 days after surgery.  You may leave the incision open to air.  You may have skin tapes (Steri Strips) covering the incision(s).  Leave them on until one week, then remove.  You may replace a dressing/Band-Aid to cover the incision for comfort if you wish.      7. ACTIVITIES as tolerated:   a. You may resume regular (light) daily activities beginning the next day-such as daily self-care, walking, climbing stairs-gradually increasing activities as tolerated.  If you can walk 30 minutes without difficulty, it is safe to try  more intense activity such as jogging, treadmill, bicycling, low-impact aerobics, swimming, etc. b. Save the most intensive and strenuous activity for last such as sit-ups, heavy lifting, contact sports, etc  Refrain from any heavy lifting or straining until you are off narcotics for pain control.   c. DO NOT PUSH THROUGH PAIN.  Let pain be your guide: If it hurts to do something, don't do it.  Pain is your body warning you to avoid that activity for another week until the pain goes down. d. You may drive when you are no longer taking prescription pain medication, you can comfortably wear a seatbelt, and you can safely maneuver your car and apply brakes. e. Bonita Quin may have sexual intercourse when it is comfortable.  8. FOLLOW UP in our office a. Please call [facility] at [phone] to set up an appointment to see your surgeon in the office for a follow-up appointment approximately 2-3 weeks after your surgery. b. Make sure that you call for this appointment the day you arrive home to insure a convenient appointment time. 9. IF  YOU HAVE DISABILITY OR FAMILY LEAVE FORMS, BRING THEM TO THE OFFICE FOR PROCESSING.  DO NOT GIVE THEM TO YOUR DOCTOR.   WHEN TO CALL us [phone]: 1. Poor pain control 2. Reactions / problems with new medications (rash/itching, nausea, etc)  3. Fever over 101.5 F (38.5 C) 4. Worsening swelling or bruising 5. Continued bleeding from incision. 6. Increased pain, redness, or drainage from the incision 7. Difficulty breathing / swallowing   The clinic staff is available to answer your questions during regular business hours (8:30am-5pm).  Please don't hesitate to call and ask to speak to one of our nurses for clinical concerns.   If you have a medical emergency, go to the nearest emergency room or call 911.  A surgeon from [facility] is always on call at the [facility], [place] [address], [address], [place], [place]  [address]  ? MAIN: [phone] ? TOLL FREE: [phone] ?  FAX [phone] [email or web address]

## 2015-02-05 ENCOUNTER — Encounter (HOSPITAL_COMMUNITY): Payer: Self-pay | Admitting: Emergency Medicine

## 2015-02-05 ENCOUNTER — Emergency Department (HOSPITAL_COMMUNITY)
Admission: EM | Admit: 2015-02-05 | Discharge: 2015-02-05 | Disposition: A | Discharge status: Home / Self Care | Payer: 59 | Source: Home / Self Care | Attending: Family Medicine | Admitting: Family Medicine

## 2015-02-05 ENCOUNTER — Emergency Department (INDEPENDENT_AMBULATORY_CARE_PROVIDER_SITE_OTHER): Payer: 59

## 2015-02-05 DIAGNOSIS — J40 Bronchitis, not specified as acute or chronic: Secondary | ICD-10-CM | POA: Diagnosis not present

## 2015-02-05 MED ORDER — PREDNISONE 10 MG PO TABS: 30.0000 mg | ORAL_TABLET | Freq: Every day | ORAL | Status: DC

## 2015-02-05 MED ORDER — GUAIFENESIN-CODEINE 100-10 MG/5ML PO SOLN: 5.0000 mL | Freq: Every evening | ORAL | Status: DC | PRN

## 2015-02-05 MED ORDER — AZITHROMYCIN 250 MG PO TABS: 250.0000 mg | ORAL_TABLET | Freq: Every day | ORAL | Status: DC

## 2015-02-05 NOTE — ED Provider Notes (Signed)
Aaron Munoz is a 29 y.o. male who presents to Urgent Care today for cough. Patient has a six-day history of productive cough. This is associated with chills but no fever wheezing chest tightness or shortness of breath. No vomiting or diarrhea or abdominal pain. He has tried multiple over-the-counter medications which have helped some. The cough was especially bothersome yesterday evening while trying to sleep.   Past Medical History  Diagnosis Date  . Pilonidal cyst with abscess    History reviewed. No pertinent past surgical history. History  Substance Use Topics  . Smoking status: Never Smoker   . Smokeless tobacco: Not on file  . Alcohol Use: No   ROS as above Medications: No current facility-administered medications for this encounter.   Current Outpatient Prescriptions  Medication Sig Dispense Refill  . azithromycin (ZITHROMAX) 250 MG tablet Take 1 tablet (250 mg total) by mouth daily. Take first 2 tablets together, then 1 every day until finished. 6 tablet 0  . guaiFENesin-codeine 100-10 MG/5ML syrup Take 5 mLs by mouth at bedtime as needed for cough. 120 mL 0  . predniSONE (DELTASONE) 10 MG tablet Take 3 tablets (30 mg total) by mouth daily. 15 tablet 0   No Known Allergies   Exam:  BP 139/92 mmHg  Pulse 77  Temp(Src) 98.5 F (36.9 C) (Oral)  Resp 16  SpO2 99% Gen: Well NAD nontoxic appearing HEENT: EOMI,  MMM is teary her pharynx with cobblestoning. Normal tympanic membranes bilaterally. Clear nasal discharge. Lungs: Normal work of breathing. Crackles right lower lung Heart: RRR no MRG Abd: NABS, Soft. Nondistended, Nontender Exts: Brisk capillary refill, warm and well perfused.   No results found for this or any previous visit (from the past 24 hour(s)). Dg Chest 2 View  02/05/2015   CLINICAL DATA:  Productive cough.  Crackles in the right lung.  EXAM: CHEST  2 VIEW  COMPARISON:  10/27/2012  FINDINGS: There is peribronchial thickening, more prominent on the  right than the left. No consolidative infiltrates or effusions. Heart size and pulmonary vascularity are normal. No osseous abnormality.  IMPRESSION: Bronchitic changes, more prominent on the right.   Electronically Signed   By: Francene BoyersJames  Maxwell M.D.   On: 02/05/2015 09:05    Assessment and Plan: 29 y.o. male with bronchitis. Treat with prednisone and codeine cough syrup. Uses azithromycin if not better.  Discussed warning signs or symptoms. Please see discharge instructions. Patient expresses understanding.     Rodolph BongEvan S Jasten Guyette, MD 02/05/15 [phone]

## 2015-02-05 NOTE — Discharge Instructions (Signed)
Thank you for coming in today. Take prednisone daily for 5 days. Use the cough medicine at bedtime as needed. Do not drive after taking the medicine.  Use azithromycin if not better.   Acute Bronchitis Bronchitis is inflammation of the airways that extend from the windpipe into the lungs (bronchi). The inflammation often causes mucus to develop. This leads to a cough, which is the most common symptom of bronchitis.  In acute bronchitis, the condition usually develops suddenly and goes away over time, usually in a couple weeks. Smoking, allergies, and asthma can make bronchitis worse. Repeated episodes of bronchitis may cause further lung problems.  CAUSES Acute bronchitis is most often caused by the same virus that causes a cold. The virus can spread from person to person (contagious) through coughing, sneezing, and touching contaminated objects. SIGNS AND SYMPTOMS   Cough.   Fever.   Coughing up mucus.   Body aches.   Chest congestion.   Chills.   Shortness of breath.   Sore throat.  DIAGNOSIS  Acute bronchitis is usually diagnosed through a physical exam. Your health care provider will also ask you questions about your medical history. Tests, such as chest X-rays, are sometimes done to rule out other conditions.  TREATMENT  Acute bronchitis usually goes away in a couple weeks. Oftentimes, no medical treatment is necessary. Medicines are sometimes given for relief of fever or cough. Antibiotic medicines are usually not needed but may be prescribed in certain situations. In some cases, an inhaler may be recommended to help reduce shortness of breath and control the cough. A cool mist vaporizer may also be used to help thin bronchial secretions and make it easier to clear the chest.  HOME CARE INSTRUCTIONS  Get plenty of rest.   Drink enough fluids to keep your urine clear or pale yellow (unless you have a medical condition that requires fluid restriction). Increasing  fluids may help thin your respiratory secretions (sputum) and reduce chest congestion, and it will prevent dehydration.   Take medicines only as directed by your health care provider.  If you were prescribed an antibiotic medicine, finish it all even if you start to feel better.  Avoid smoking and secondhand smoke. Exposure to cigarette smoke or irritating chemicals will make bronchitis worse. If you are a smoker, consider using nicotine gum or skin patches to help control withdrawal symptoms. Quitting smoking will help your lungs heal faster.   Reduce the chances of another bout of acute bronchitis by washing your hands frequently, avoiding people with cold symptoms, and trying not to touch your hands to your mouth, nose, or eyes.   Keep all follow-up visits as directed by your health care provider.  SEEK MEDICAL CARE IF: Your symptoms do not improve after 1 week of treatment.  SEEK IMMEDIATE MEDICAL CARE IF:  You develop an increased fever or chills.   You have chest pain.   You have severe shortness of breath.  You have bloody sputum.   You develop dehydration.  You faint or repeatedly feel like you are going to pass out.  You develop repeated vomiting.  You develop a severe headache. MAKE SURE YOU:   Understand these instructions.  Will watch your condition.  Will get help right away if you are not doing well or get worse. Document Released: 11/17/2004 Document Revised: 02/24/2014 Document Reviewed: 04/02/2013 [facility] Patient Information 2015 [place], [place]. This information is not intended to replace advice given to you by your health care provider. Make sure  you discuss any questions you have with your health care provider.

## 2015-02-05 NOTE — ED Notes (Signed)
C/o cough with phlegm.  Reports phlegm has been yellow x 2 days, clear phlegm today per patient.  Patient has chills and bodyaches

## 2015-02-13 ENCOUNTER — Emergency Department (INDEPENDENT_AMBULATORY_CARE_PROVIDER_SITE_OTHER): Payer: 59

## 2015-02-13 ENCOUNTER — Emergency Department (INDEPENDENT_AMBULATORY_CARE_PROVIDER_SITE_OTHER)
Admission: EM | Admit: 2015-02-13 | Discharge: 2015-02-13 | Disposition: A | Discharge status: Home / Self Care | Payer: 59 | Source: Home / Self Care | Attending: Family Medicine | Admitting: Family Medicine

## 2015-02-13 ENCOUNTER — Encounter (HOSPITAL_COMMUNITY): Payer: Self-pay | Admitting: Emergency Medicine

## 2015-02-13 DIAGNOSIS — J209 Acute bronchitis, unspecified: Secondary | ICD-10-CM | POA: Diagnosis not present

## 2015-02-13 MED ORDER — ALBUTEROL SULFATE (2.5 MG/3ML) 0.083% IN NEBU
INHALATION_SOLUTION | RESPIRATORY_TRACT | Status: AC
  Filled 2015-02-13: qty 3

## 2015-02-13 MED ORDER — AEROCHAMBER PLUS FLO-VU LARGE MISC: 1.0000 | Freq: Once | Status: AC

## 2015-02-13 MED ORDER — IPRATROPIUM BROMIDE 0.06 % NA SOLN: 2.0000 | NASAL | Status: AC | PRN

## 2015-02-13 MED ORDER — IPRATROPIUM BROMIDE 0.02 % IN SOLN
RESPIRATORY_TRACT | Status: AC
  Filled 2015-02-13: qty 2.5

## 2015-02-13 MED ORDER — IPRATROPIUM BROMIDE 0.02 % IN SOLN
0.5000 mg | Freq: Once | RESPIRATORY_TRACT | Status: AC
  Administered 2015-02-13: 0.5 mg via RESPIRATORY_TRACT

## 2015-02-13 MED ORDER — KETOROLAC TROMETHAMINE 60 MG/2ML IM SOLN
INTRAMUSCULAR | Status: AC
  Filled 2015-02-13: qty 2

## 2015-02-13 MED ORDER — HYDROCOD POLST-CPM POLST ER 10-8 MG/5ML PO SUER: 5.0000 mL | Freq: Two times a day (BID) | ORAL | Status: DC | PRN

## 2015-02-13 MED ORDER — KETOROLAC TROMETHAMINE 60 MG/2ML IM SOLN
60.0000 mg | Freq: Once | INTRAMUSCULAR | Status: AC
  Administered 2015-02-13: 60 mg via INTRAMUSCULAR

## 2015-02-13 MED ORDER — ALBUTEROL SULFATE HFA 108 (90 BASE) MCG/ACT IN AERS: 2.0000 | INHALATION_SPRAY | RESPIRATORY_TRACT | Status: AC | PRN

## 2015-02-13 MED ORDER — PREDNISONE 50 MG PO TABS: 50.0000 mg | ORAL_TABLET | Freq: Every day | ORAL | Status: DC

## 2015-02-13 MED ORDER — ALBUTEROL SULFATE (2.5 MG/3ML) 0.083% IN NEBU
5.0000 mg | INHALATION_SOLUTION | Freq: Once | RESPIRATORY_TRACT | Status: AC
Start: 2015-02-13 — End: 2015-02-13
  Administered 2015-02-13: 5 mg via RESPIRATORY_TRACT

## 2015-02-13 NOTE — ED Provider Notes (Signed)
CSN: [id number]     Arrival date & time 02/13/15  4098 History   None    Chief Complaint  Patient presents with  . URI   (Consider location/radiation/quality/duration/timing/severity/associated sxs/prior Treatment) HPI     29 year old male presents for evaluation of cough, chest pain, shortness of breath. He has been sick for 2 weeks with a hacking cough that has become more productive in the past week. Starting last night he began to have sternal chest pain that radiated up across his chest to the right and left. This is worse with coughing. He also began to feel little short of breath when this started. He feels somewhat better this morning. He was seen here one week ago for this problem and was started with a codeine cough medication, prednisone, and azithromycin. The cough medication helped but he does not think the prednisone or the azithromycin did anything. His had some subjective fevers, not measured. No NVD, recent travel, or sick contacts. He does not smoke  Past Medical History  Diagnosis Date  . Pilonidal cyst with abscess    History reviewed. No pertinent past surgical history. No family history on file. History  Substance Use Topics  . Smoking status: Never Smoker   . Smokeless tobacco: Not on file  . Alcohol Use: No    Review of Systems  Constitutional: Positive for fever and chills.  HENT: Positive for congestion and sore throat. Negative for ear pain.   Respiratory: Positive for cough, chest tightness and shortness of breath.   Cardiovascular: Positive for chest pain.  Gastrointestinal: Positive for diarrhea (loose stools since starting the azithromycin). Negative for nausea and vomiting.  Skin: Negative for rash.  All other systems reviewed and are negative.   Allergies  Review of patient's allergies indicates no known allergies.  Home Medications   Prior to Admission medications   Medication Sig Start Date End Date Taking? Authorizing Provider  azithromycin  (ZITHROMAX) 250 MG tablet Take 1 tablet (250 mg total) by mouth daily. Take first 2 tablets together, then 1 every day until finished. 02/05/15  Yes Rodolph Bong, MD  guaiFENesin-codeine 100-10 MG/5ML syrup Take 5 mLs by mouth at bedtime as needed for cough. 02/05/15  Yes Rodolph Bong, MD  albuterol (PROVENTIL HFA;VENTOLIN HFA) 108 (90 BASE) MCG/ACT inhaler Inhale 2 puffs into the lungs every 4 (four) hours as needed for wheezing. 02/13/15   Graylon Good, PA-C  chlorpheniramine-HYDROcodone (TUSSIONEX PENNKINETIC ER) 10-8 MG/5ML SUER Take 5 mLs by mouth every 12 (twelve) hours as needed for cough. 02/13/15   Adrian Blackwater Trev Boley, PA-C  ipratropium (ATROVENT) 0.06 % nasal spray Place 2 sprays into both nostrils every 4 (four) hours as needed for rhinitis. 02/13/15   Graylon Good, PA-C  predniSONE (DELTASONE) 10 MG tablet Take 3 tablets (30 mg total) by mouth daily. 02/05/15   Rodolph Bong, MD  predniSONE (DELTASONE) 50 MG tablet Take 1 tablet (50 mg total) by mouth daily with breakfast. 02/13/15   Graylon Good, PA-C  Spacer/Aero-Holding Chambers (AEROCHAMBER PLUS FLO-VU LARGE) MISC 1 each by Other route once. 02/13/15   Adrian Blackwater Karlisha Mathena, PA-C   BP 137/77 mmHg  Pulse 89  Temp(Src) 98.2 F (36.8 C) (Oral)  Resp 18  SpO2 96% Physical Exam  Constitutional: He is oriented to person, place, and time. He appears well-developed and well-nourished. No distress.  HENT:  Head: Normocephalic and atraumatic.  Right Ear: External ear normal.  Left Ear: External ear normal.  Nose:  Nose normal.  Mouth/Throat: Oropharynx is clear and moist. No oropharyngeal exudate.  Eyes: Conjunctivae are normal.  Neck: Normal range of motion. Neck supple.  Cardiovascular: Normal rate, regular rhythm and normal heart sounds.   Pulmonary/Chest: Effort normal and breath sounds normal. No respiratory distress. He exhibits tenderness (sternal tenderness).  Lymphadenopathy:    He has no cervical adenopathy.  Neurological: He is  alert and oriented to person, place, and time. Coordination normal.  Skin: Skin is warm and dry. No rash noted. He is not diaphoretic.  Psychiatric: He has a normal mood and affect. Judgment normal.  Nursing note and vitals reviewed.   ED Course  ED EKG  Date/Time: 02/13/2015 10:24 AM Performed by: Graylon GoodBAKER, Makail Watling H Authorized by: Autumn MessingBAKER, Payslie Mccaig H Comparison: not compared with previous ECG  Rhythm: sinus rhythm Rate: normal QRS axis: normal Conduction: conduction normal ST Segments: ST segments normal T depression: III T flattening: aVF Other: no other findings Clinical impression: non-specific ECG Comments: EKG independently reviewed by me as above   (including critical care time) Labs Review Labs Reviewed - No data to display  Imaging Review Dg Chest 2 View  02/13/2015   CLINICAL DATA:  Chest pain, cough  EXAM: CHEST  2 VIEW  COMPARISON:  02/05/2015  FINDINGS: The heart size and mediastinal contours are within normal limits. Both lungs are clear. The visualized skeletal structures are unremarkable.  IMPRESSION: No active cardiopulmonary disease.   Electronically Signed   By: Alcide CleverMark  Lukens M.D.   On: 02/13/2015 09:56     MDM   1. Acute bronchitis, unspecified organism    X-ray and EKG are normal. He has some improvement with the Toradol and the DuoNeb. Will discharge with cough suppressant, albuterol inhaler, 3 days of prednisone. Follow-up if no improvement or worsening.   Meds ordered this encounter  Medications  . ketorolac (TORADOL) injection 60 mg    Sig:   . albuterol (PROVENTIL) (2.5 MG/3ML) 0.083% nebulizer solution 5 mg    Sig:   . ipratropium (ATROVENT) nebulizer solution 0.5 mg    Sig:   . predniSONE (DELTASONE) 50 MG tablet    Sig: Take 1 tablet (50 mg total) by mouth daily with breakfast.    Dispense:  3 tablet    Refill:  0    Order Specific Question:  Supervising Provider    Answer:  Linna HoffKINDL, JAMES D [id number]]  . chlorpheniramine-HYDROcodone (TUSSIONEX  PENNKINETIC ER) 10-8 MG/5ML SUER    Sig: Take 5 mLs by mouth every 12 (twelve) hours as needed for cough.    Dispense:  115 mL    Refill:  0    Order Specific Question:  Supervising Provider    Answer:  Linna HoffKINDL, JAMES D [phone][[id number]]  . ipratropium (ATROVENT) 0.06 % nasal spray    Sig: Place 2 sprays into both nostrils every 4 (four) hours as needed for rhinitis.    Dispense:  15 mL    Refill:  1    Order Specific Question:  Supervising Provider    Answer:  Bradd CanaryKINDL, JAMES D [id number]]  . albuterol (PROVENTIL HFA;VENTOLIN HFA) 108 (90 BASE) MCG/ACT inhaler    Sig: Inhale 2 puffs into the lungs every 4 (four) hours as needed for wheezing.    Dispense:  1 Inhaler    Refill:  0    Order Specific Question:  Supervising Provider    Answer:  Bradd CanaryKINDL, JAMES D [id number]]  . Spacer/Aero-Holding Chambers (AEROCHAMBER PLUS FLO-VU LARGE) MISC    Sig: 1 each  by Other route once.    Dispense:  1 each    Refill:  0    Order Specific Question:  Supervising Provider    Answer:  Bradd Canary D [[id number]]       Graylon Good, PA-C 02/13/15 1059

## 2015-02-13 NOTE — Discharge Instructions (Signed)

## 2015-02-13 NOTE — ED Notes (Signed)
C/o persistent cold sx onset 2 weeks Seen here on 4/14 and dx w/bronchitis Given cough syrup and z-pack then he began on Monday Sx today include: chest pain, BA, productive cough, chills Denies fevers Alert, no signs of acute distress.

## 2015-10-20 ENCOUNTER — Emergency Department (INDEPENDENT_AMBULATORY_CARE_PROVIDER_SITE_OTHER)
Admission: EM | Admit: 2015-10-20 | Discharge: 2015-10-20 | Disposition: A | Discharge status: Home / Self Care | Payer: 59 | Source: Home / Self Care | Attending: Emergency Medicine | Admitting: Emergency Medicine

## 2015-10-20 ENCOUNTER — Encounter (HOSPITAL_COMMUNITY): Payer: Self-pay | Admitting: Emergency Medicine

## 2015-10-20 DIAGNOSIS — M545 Low back pain, unspecified: Secondary | ICD-10-CM

## 2015-10-20 DIAGNOSIS — L0591 Pilonidal cyst without abscess: Secondary | ICD-10-CM | POA: Diagnosis not present

## 2015-10-20 MED ORDER — HYDROCODONE-ACETAMINOPHEN 5-325 MG PO TABS: 2.0000 | ORAL_TABLET | ORAL | Status: AC | PRN

## 2015-10-20 MED ORDER — DICLOFENAC SODIUM 75 MG PO TBEC: 75.0000 mg | DELAYED_RELEASE_TABLET | Freq: Two times a day (BID) | ORAL | Status: AC

## 2015-10-20 NOTE — ED Provider Notes (Signed)
HPI  SUBJECTIVE:  Aaron Munoz is a 29 y.o. male who presents with right-sided back pain and a painful  mass on his right gluteal cleft. Patient has a pilonidal cyst here, and states that his been bothering him for the past 4 days. He reports right, sharp, achy, low back pain radiating to his buttock for the past 4 days. Thinks that is related to the pilonidal cyst. Symptoms are worse with bending forward, sitting, no alleviating factors. He has tried ibuprofen 800 mg, Tylenol, muscle relaxant. He denies nausea, vomiting, fevers, abdominal pain, urinary complaints, saddle anesthesia, urinary, fecal incontinence, weakness in his lower extremities, numbness tingling in his legs. Past medical history negative for back injury, UTI, pyelonephritis, nephrolithiasis, diabetes, hypertension. No history of AAA, HIV, IV drug use, cancer, multiple myeloma, osteoporosis, steroid use.   Past Medical History  Diagnosis Date  . Pilonidal cyst with abscess     History reviewed. No pertinent past surgical history.  No family history on file.  Social History  Substance Use Topics  . Smoking status: Never Smoker   . Smokeless tobacco: None  . Alcohol Use: No    No current facility-administered medications for this encounter.  Current outpatient prescriptions:  .  albuterol (PROVENTIL HFA;VENTOLIN HFA) 108 (90 BASE) MCG/ACT inhaler, Inhale 2 puffs into the lungs every 4 (four) hours as needed for wheezing., Disp: 1 Inhaler, Rfl: 0 .  diclofenac (VOLTAREN) 75 MG EC tablet, Take 1 tablet (75 mg total) by mouth 2 (two) times daily. Take with food, Disp: 30 tablet, Rfl: 0 .  HYDROcodone-acetaminophen (NORCO/VICODIN) 5-325 MG tablet, Take 2 tablets by mouth every 4 (four) hours as needed for moderate pain., Disp: 20 tablet, Rfl: 0 .  ipratropium (ATROVENT) 0.06 % nasal spray, Place 2 sprays into both nostrils every 4 (four) hours as needed for rhinitis., Disp: 15 mL, Rfl: 1 .  Spacer/Aero-Holding Chambers  (AEROCHAMBER PLUS FLO-VU LARGE) MISC, 1 each by Other route once., Disp: 1 each, Rfl: 0  No Known Allergies   ROS  As noted in HPI.   Physical Exam  There were no vitals taken for this visit.  Constitutional: Well developed, well nourished, no acute distress. Patient sitting on his left buttock. Eyes:  EOMI, conjunctiva normal bilaterally HENT: Normocephalic, atraumatic,mucus membranes moist Respiratory: Normal inspiratory effort, lungs clear bilaterally Cardiovascular: Normal rate regular rhythm, no murmurs, rubs, gallops GI: nondistended soft, no suprapubic, flank tenderness Back: No CVA tenderness. Right paralumbar tenderness and tenderness over the right buttock.  + / - paralumbar tenderness, - muscle spasm. No bony tenderness. Bilateral lower extremities nontender , baseline ROM with intact PT pulses. No pain with int/ext rotation flex/extension hips bilaterally. SLR neg bilaterally. Sensation baseline light touch bilaterally for Pt, DTR's symmetric and intact bilaterally KJ, Motor symmetric bilateral 5/5 hip flexion, quadriceps, hamstrings, EHL, foot dorsiflexion, foot plantarflexion, gait normal.. Rectal: Tender non-erythematous mass that gluteal cleft consistent with a pilonidal cyst no drainage. skin: No rash, skin intact Musculoskeletal: no deformities Neurologic: Alert & oriented x 3, no focal neuro deficits Psychiatric: Speech and behavior appropriate   ED Course   Medications - No data to display  No orders of the defined types were placed in this encounter.    No results found for this or any previous visit (from the past 24 hour(s)). No results found.  ED Clinical Impression  Pilonidal cyst without infection  Right-sided low back pain without sciatica   ED Assessment/Plan  No evidence of infection this time. Suspect  that the back pain is pain from the inflamed pilonidal cyst also from walking and sitting abnormally. Patient was sitting on his left buttock  only when I walked into the room. No evidence of neurologic emergency. Diclofenac, Norco, referral to [place] surgery. We'll also provide primary care referral list.  Discussed MDM, plan and followup with patient . Discussed sn/sx that should prompt return to the UC or ED. Patient agrees with plan.   *This clinic note was created using Dragon dictation software. Therefore, there may be occasional mistakes despite careful proofreading.  ?    Melynda Ripple, MD 10/20/15 [phone]

## 2015-10-20 NOTE — ED Notes (Signed)
Here with lower back pain radiating to legs with throb pain r/t cyst located inner left buttock Pt has Hx multiple cysts in the past needing surgery f/u Requesting medication and referral to [facility] Pain with sitting or movement

## 2015-10-20 NOTE — Discharge Instructions (Signed)
Your back pain is most likely from the altered sitting and walking mechanics from the inflamed pilonidal cyst. You will need to have this surgically removed. Follow-up with [place] surgery as soon as possible. Go to the ER if you get worse, for pain not controlled medications, fevers above 100.4, weakness in your legs, if you accidentally urinate or defecate on yourself, or other concerns.  This practice is taking new patients. They will see you even if you do not have insurance.  [facility] [address] [place], [place]  [address] [phone]  Go to www.goodrx.com to look up your medications. This will give you a list of where you can find your prescriptions at the most affordable prices.   If you have no primary doctor, here are some resources that may be helpful:  Medicaid-accepting [facility] [place] Providers: - [facility]- [address]  [phone];   - [facility]- [address], [address] [phone]  - [facility]- [address], [address] [phone] [facility]- [address] [place] [address]  [phone]  - Renaye RakersVeita Bland- [address] [phone]  Only accepts [place] patients       after they have her name applied to their card  -Dr. Jackie PlumGeorge Osei-Bonsu, [facility]. [address] [place] [address]    [place], [place] [address]  [phone]  Self Pay (no insurance) in [place]: - Sickle Cell Patients: Dr Willey BladeEric Dean, [facility] [address] [phone]  - Health [phone]  - Physician Referral Service- [phone]  - [facility]- [address], [place], [phone];  Monday to Friday, 9 a.m. - 7 p.m.; Saturday 9 a.m. to 1 p.m.  [facility] [place]- [address] [place], [place] [phone]  - [facility]- [address] [place]  [address]      [phone] - [facility]- [address] [phone]  [facility], [address] [place]; [phone]; or [address] [place] [address], [place]; [phone].   [facility] [place], [place] [place]; [phone]; Monday to Wednesday, 8:30 a.m. - 5 p.m.; Thursday, 8:30 a.m. - 8 p.m.  [facility], [address], [address], [place]; [phone]; Monday to Friday, 8 a.m. - 4:30 p.m.   [facility], [place] [place], [phone]; first and third Saturday of the month, 9:30 a.m. - 12:30 p.m.  [facility], [address] [place], [phone]; second Saturday of the month, 9 a.m. -noon.  [facility] for children. For information, call [phone]; [phone]; or [phone].  Other agencies that provide inexpensive medical care:     [facility]  [phone]    [facility]  [phone]    [facility]  [phone] [address] [place] [phone]    [facility]  [phone]    [facility]  [phone], [phone]; or [phone].  Chronic Pain Problems Contact [facility]  [phone] Patients need to be referred by their primary care doctor.  [facility] Resources  [facility] [place]     [employer]                          [facility]  Dept. [address] [place]                       [address]      [address] [place] [address]   [phone] (After Hours)  General Information: Finding a doctor when you do not have health insurance can be tricky. Although you are not limited by an insurance plan, you are of course limited by her finances and how much but he can pay out of pocket.  What are your options if you don't have health insurance?   1) Find a Librarian, academic and Pay Out of Pocket Although you won't have to find out who is covered by your insurance plan, it is a good idea to ask around and get  recommendations. You will then need to call the office and see if the doctor you have chosen will accept you as a new patient and what types of options they offer for patients who are self-pay. Some doctors offer discounts or will set up payment plans for their patients who do not have insurance, but you will need to ask so you aren't surprised when you get to your appointment.  2) Contact Your Local Health Department Not all health departments have doctors that can see patients for sick visits, but many do, so it is worth a call to see if yours does. If you don't know where your local health department is, you can check in your phone book. The CDC also has a tool to help you locate your state's health department, and many state websites also have listings of all of their local health departments.  3) Find a Walk-in Clinic If your illness is not likely to be very severe or complicated, you may want to try a walk in clinic. These are popping up all over the country in pharmacies, drugstores, and shopping centers. They're usually staffed by nurse practitioners or physician assistants that have been trained to treat common illnesses and complaints. They're usually fairly quick and inexpensive. However, if you have serious medical issues or chronic medical problems, these are probably not your best option

## 2015-12-19 IMAGING — DX DG CHEST 2V
2 series · 2 of 2 positions shown · non-contrast
Comparison: 02/05/2015

CLINICAL DATA: Chest pain, cough

EXAM:
CHEST  2 VIEW

[chest pa]
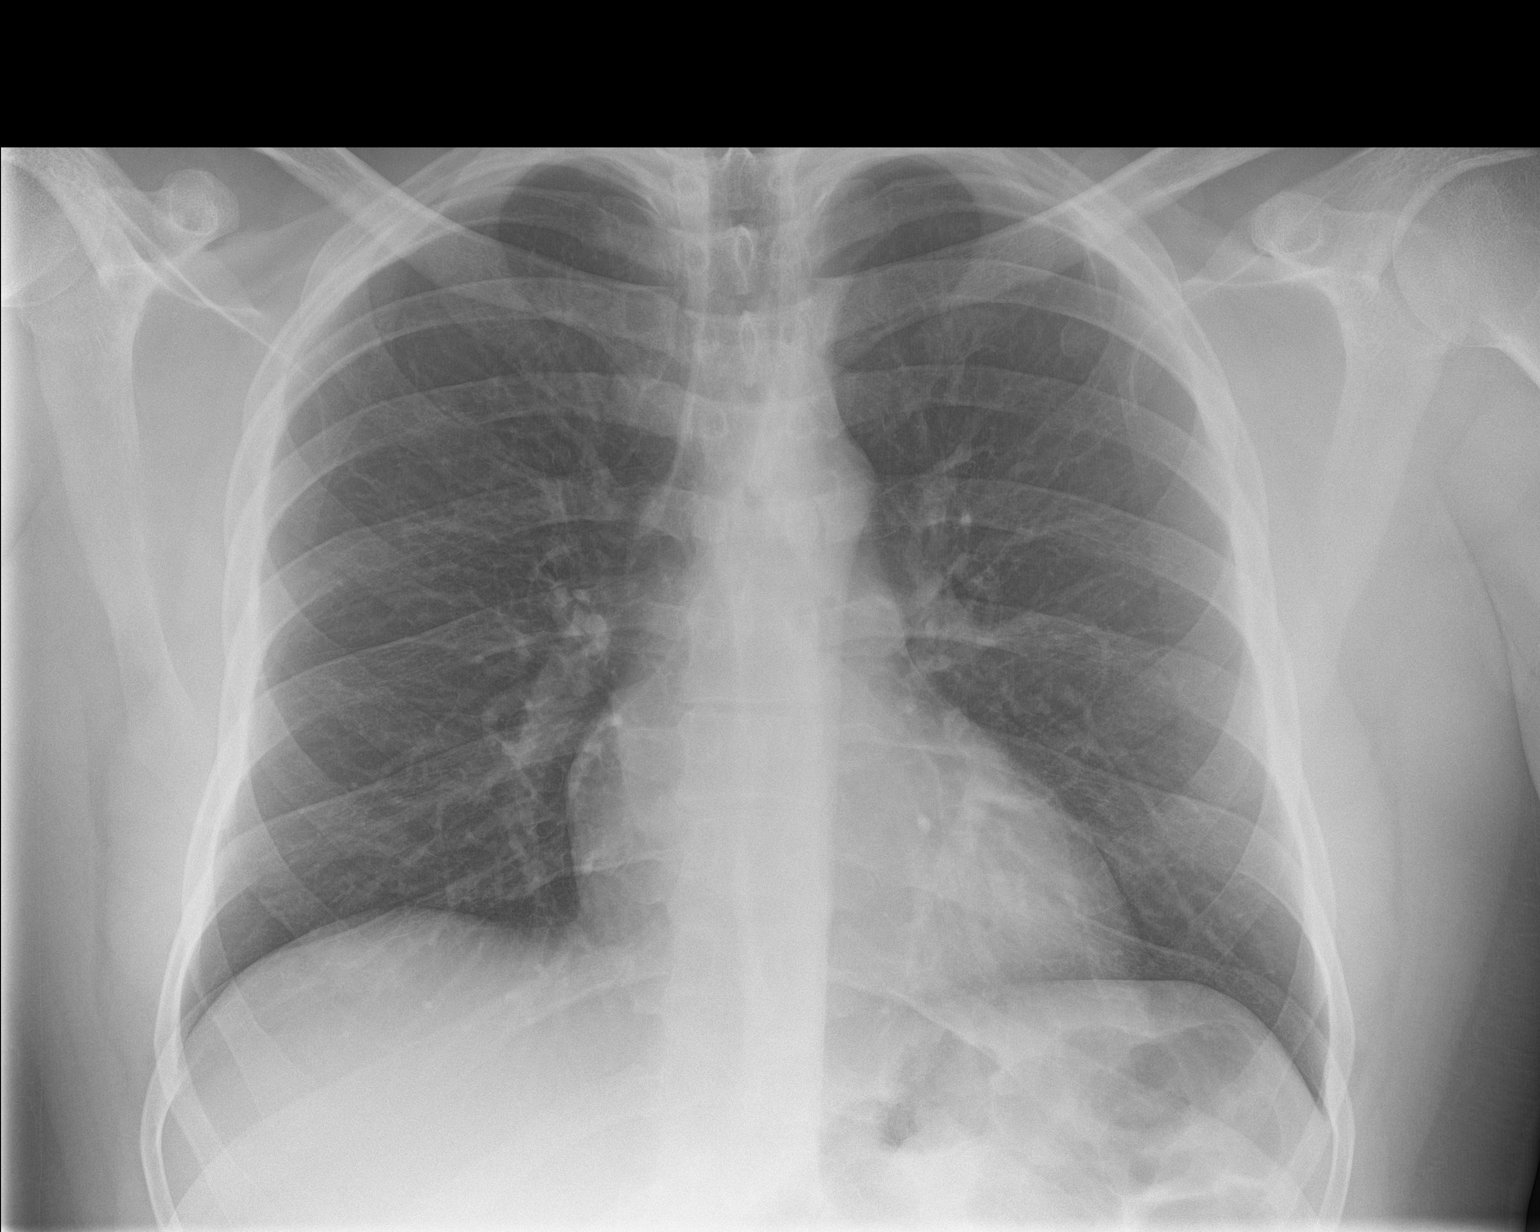

[chest lat]
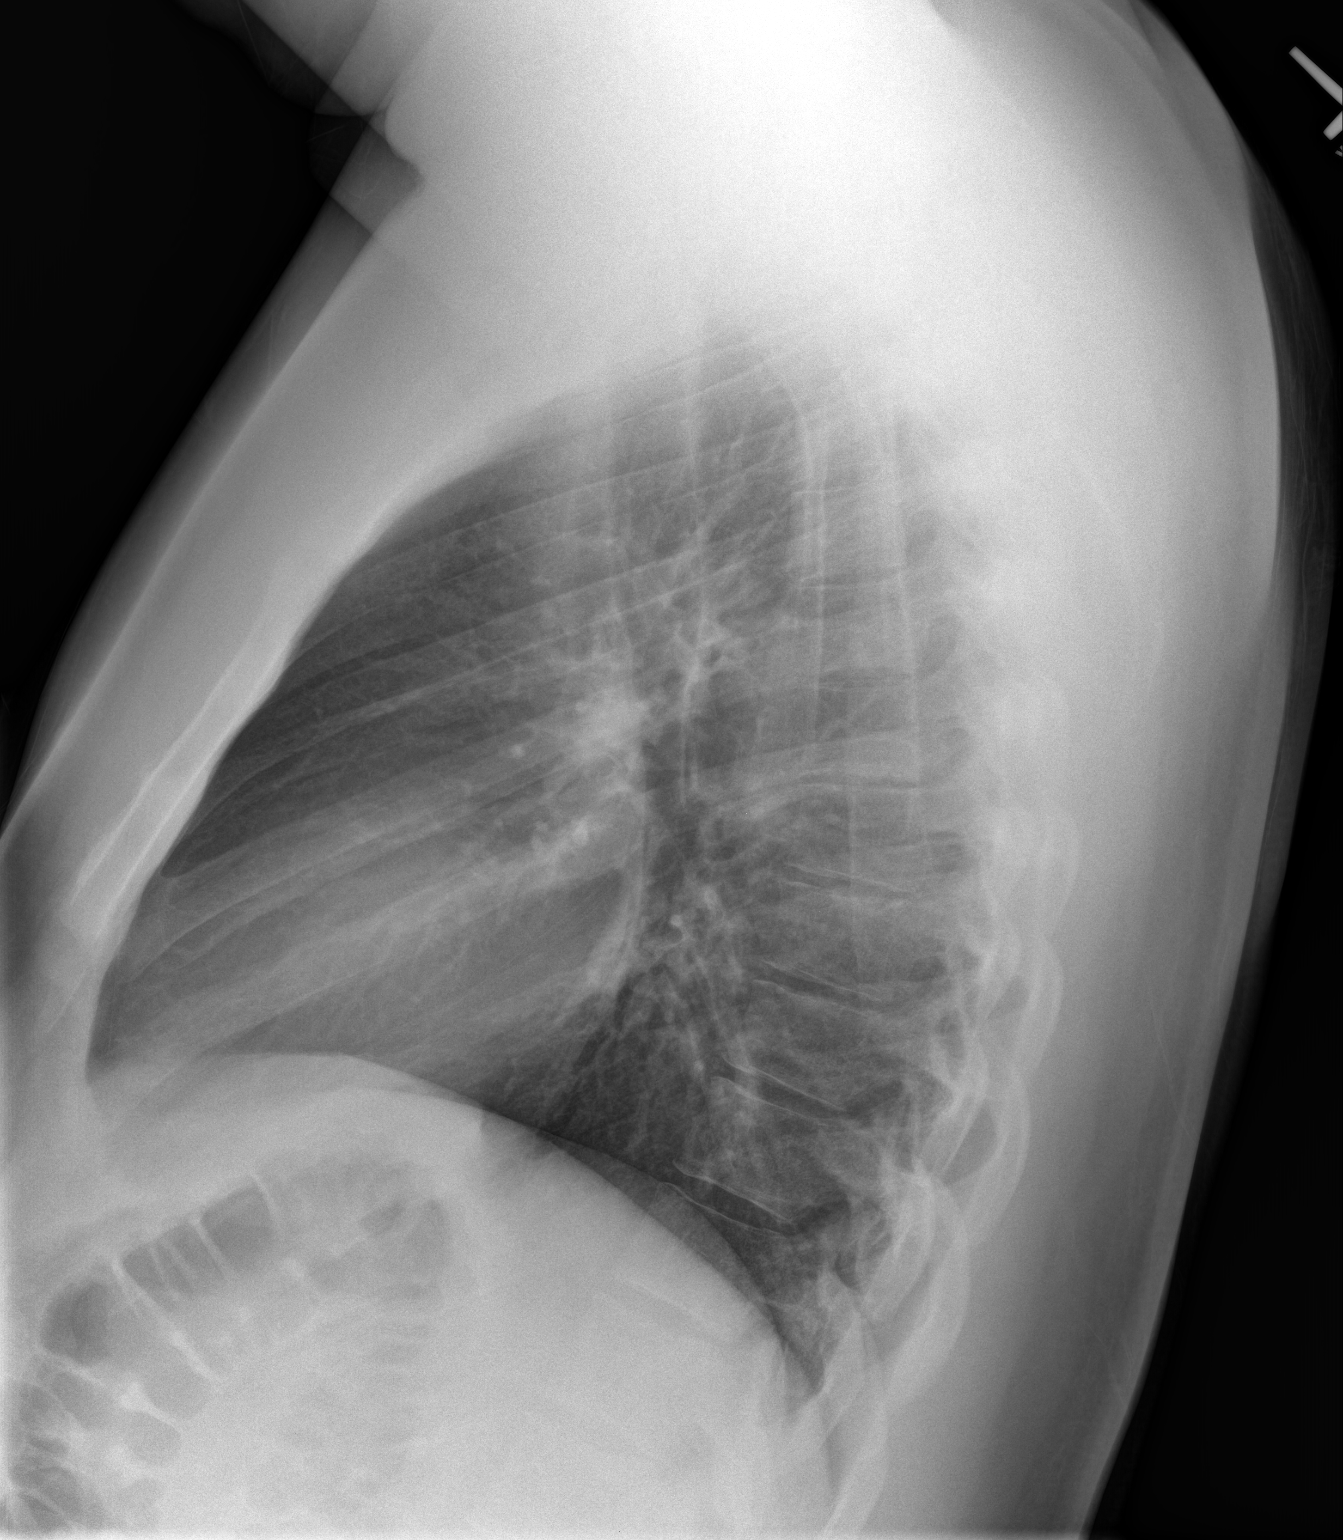

[2 of 2 positions shown; findings below may reference images not displayed]

FINDINGS: The heart size and mediastinal contours are within normal limits.
Both lungs are clear. The visualized skeletal structures are
unremarkable.
IMPRESSION: No active cardiopulmonary disease.
